# Patient Record
Sex: Female | Born: 1985 | Race: Black or African American | Hispanic: No | Marital: Married | Smoking: Never smoker
Health system: Southern US, Community
[De-identification: ages and names within clinical notes are randomized; demographics above are authoritative.]

## PROBLEM LIST (undated history)

## (undated) DIAGNOSIS — J45909 Unspecified asthma, uncomplicated: Secondary | ICD-10-CM

## (undated) DIAGNOSIS — F32A Depression, unspecified: Secondary | ICD-10-CM

## (undated) DIAGNOSIS — D219 Benign neoplasm of connective and other soft tissue, unspecified: Secondary | ICD-10-CM

## (undated) DIAGNOSIS — F329 Major depressive disorder, single episode, unspecified: Secondary | ICD-10-CM

## (undated) DIAGNOSIS — E559 Vitamin D deficiency, unspecified: Secondary | ICD-10-CM

## (undated) DIAGNOSIS — D649 Anemia, unspecified: Secondary | ICD-10-CM

## (undated) HISTORY — DX: Depression, unspecified: F32.A

## (undated) HISTORY — DX: Benign neoplasm of connective and other soft tissue, unspecified: D21.9

## (undated) HISTORY — PX: HAND SURGERY: SHX662

## (undated) HISTORY — DX: Unspecified asthma, uncomplicated: J45.909

## (undated) HISTORY — DX: Major depressive disorder, single episode, unspecified: F32.9

## (undated) HISTORY — PX: WISDOM TOOTH EXTRACTION: SHX21

## (undated) HISTORY — DX: Anemia, unspecified: D64.9

## (undated) HISTORY — DX: Vitamin D deficiency, unspecified: E55.9

---

## 2017-08-05 NOTE — L&D Delivery Note (Signed)
Delivery Note At 8:26 AM a viable female was delivered via Vaginal, Spontaneous (Presentation: ;  ).  APGAR: 9, 9; weight  .   Placenta status: , .  Cord:  with the following complications: .  Cord pH: sent results pending  Anesthesia:   Episiotomy: None Lacerations: 2nd degree;Perineal Suture Repair: 2.0 vicryl rapide Est. Blood Loss (mL): 4  Mom to postpartum.  Baby to Couplet care / Skin to Skin.    [place] A 04/27/2018, 8:54 AM

## 2017-09-15 LAB — OB RESULTS CONSOLE HIV ANTIBODY (ROUTINE TESTING): HIV: NONREACTIVE

## 2017-09-15 LAB — OB RESULTS CONSOLE ANTIBODY SCREEN: Antibody Screen: NEGATIVE

## 2017-09-15 LAB — OB RESULTS CONSOLE ABO/RH: RH Type: POSITIVE

## 2017-09-15 LAB — OB RESULTS CONSOLE GC/CHLAMYDIA
Chlamydia: NEGATIVE
Gonorrhea: NEGATIVE

## 2017-09-15 LAB — OB RESULTS CONSOLE HEPATITIS B SURFACE ANTIGEN: HEP B S AG: NEGATIVE

## 2017-09-15 LAB — OB RESULTS CONSOLE RUBELLA ANTIBODY, IGM: Rubella: IMMUNE

## 2017-09-15 LAB — OB RESULTS CONSOLE RPR: RPR: NONREACTIVE

## 2017-11-17 ENCOUNTER — Other Ambulatory Visit (HOSPITAL_COMMUNITY): Payer: Self-pay | Admitting: Obstetrics and Gynecology

## 2017-11-17 DIAGNOSIS — Z3A20 20 weeks gestation of pregnancy: Secondary | ICD-10-CM

## 2017-11-17 DIAGNOSIS — Z3689 Encounter for other specified antenatal screening: Secondary | ICD-10-CM

## 2017-12-04 ENCOUNTER — Encounter (HOSPITAL_COMMUNITY): Payer: Self-pay

## 2017-12-04 ENCOUNTER — Ambulatory Visit (HOSPITAL_COMMUNITY)
Admission: RE | Admit: 2017-12-04 | Discharge: 2017-12-04 | Disposition: A | Discharge status: Home / Self Care | Payer: Self-pay | Source: Ambulatory Visit | Attending: Obstetrics and Gynecology | Admitting: Obstetrics and Gynecology

## 2017-12-04 ENCOUNTER — Encounter (HOSPITAL_COMMUNITY): Payer: Self-pay | Admitting: Radiology

## 2017-12-04 DIAGNOSIS — Z363 Encounter for antenatal screening for malformations: Secondary | ICD-10-CM | POA: Insufficient documentation

## 2017-12-04 DIAGNOSIS — Z3689 Encounter for other specified antenatal screening: Secondary | ICD-10-CM

## 2017-12-04 DIAGNOSIS — Z3A20 20 weeks gestation of pregnancy: Secondary | ICD-10-CM

## 2018-03-30 LAB — OB RESULTS CONSOLE GBS: GBS: NEGATIVE

## 2018-04-21 ENCOUNTER — Telehealth (HOSPITAL_COMMUNITY): Payer: Self-pay | Admitting: *Deleted

## 2018-04-21 ENCOUNTER — Encounter (HOSPITAL_COMMUNITY): Payer: Self-pay | Admitting: *Deleted

## 2018-04-21 NOTE — Telephone Encounter (Signed)
Preadmission screen  

## 2018-04-22 ENCOUNTER — Other Ambulatory Visit: Payer: Self-pay | Admitting: Obstetrics & Gynecology

## 2018-04-25 NOTE — H&P (Signed)
Marissa Atkinson is a 32 y.o. female, G1P0 at 40.5 weeks, presenting for induction of labor for postdates.  Pregnancy hx of intramural fibroid and small head circumference of fetus on [place] with resolution at term.  Hx of Depression in college    History of present pregnancy: Patient entered care at 9  weeks.   EDC of 04/20/218 was established by [place].   Anatomy scan:  20 weeks, with<3 % HC and an anterior placenta.   Additional [place] evaluation: serial for growth Last [place] at 37 weeks EFW 41%, BPD 3% and HC 7 % with AFI wnl.   Significant prenatal events:   Last evaluation: this week  OB History    Gravida  1   Para      Term      Preterm      AB      Living        SAB      TAB      Ectopic      Multiple      Live Births             Past Medical History:  Diagnosis Date  . Anemia   . Asthma   . Depression   . Fibroid   . Vitamin D deficiency    Past Surgical History:  Procedure Laterality Date  . HAND SURGERY    . WISDOM TOOTH EXTRACTION     Family History: family history includes Asthma in her maternal grandmother and sister; Hypertension in her mother. Social History:  reports that she has never smoked. She has never used smokeless tobacco. She reports that she drank alcohol. She reports that she does not use drugs.   Prenatal Transfer Tool  Maternal Diabetes: No Genetic Screening: Normal Maternal Ultrasounds/Referrals: Normal Fetal Ultrasounds or other Referrals:  None Maternal Substance Abuse:  No Significant Maternal Medications:  None Significant Maternal Lab Results: None  TDAP Declined Flu UTD  GNO:IBBCWU of Systems  Constitutional: Negative.   HENT: Negative.   Eyes: Negative.   Respiratory: Negative.   Cardiovascular: Negative.   Gastrointestinal: Negative.   Genitourinary: Negative.   Musculoskeletal: Negative.   Skin: Negative.   Neurological: Negative.   Endo/Heme/Allergies: Negative.   Psychiatric/Behavioral: Negative.     No Known  Allergies     Last menstrual period 06/05/2017.  Chest clear Heart RRR without murmur Abd gravid, NT, FH appropriate Pelvic: per RN Ext: +2/+2  FHR: Category 1  FHT 150 accels no decels UCs:  none  Prenatal labs: ABO, Rh: O/Positive/-- (02/11 0000) Antibody: Negative (02/11 0000) Rubella:  Immune (02/11 0000) RPR: Nonreactive (02/11 0000)  HBsAg: Negative (02/11 0000)  HIV: Non-reactive (02/11 0000)  GBS: Negative (08/26 0000) Sickle cell/Hgb electrophoresis:  AA Pap:  2018 wnl GC:  Neg Chlamydia:  Neg Genetic screenings: wnl Glucola:  103 Other:   Hgb 12.6 at NOB, 10.3 at 28 weeks       Assessment/Plan: IUP at 40.5 induction for postdates Cat 1 strip Plan: Admit to [facility] orders Pain med/epidural prn   Pleas Koch ProtheroCNM, MSN 04/25/2018, 11:11 PM

## 2018-04-26 ENCOUNTER — Encounter (HOSPITAL_COMMUNITY): Payer: Self-pay

## 2018-04-26 ENCOUNTER — Inpatient Hospital Stay (HOSPITAL_COMMUNITY)
Admission: RE | Admit: 2018-04-26 | Discharge: 2018-04-29 | DRG: 807 | Disposition: A | Discharge status: Home / Self Care | Payer: BLUE CROSS/BLUE SHIELD | Attending: Obstetrics and Gynecology | Admitting: Obstetrics and Gynecology

## 2018-04-26 ENCOUNTER — Inpatient Hospital Stay (HOSPITAL_COMMUNITY): Payer: BLUE CROSS/BLUE SHIELD | Admitting: Anesthesiology

## 2018-04-26 DIAGNOSIS — Z23 Encounter for immunization: Secondary | ICD-10-CM

## 2018-04-26 DIAGNOSIS — O480 Post-term pregnancy: Secondary | ICD-10-CM | POA: Diagnosis present

## 2018-04-26 DIAGNOSIS — Z3A40 40 weeks gestation of pregnancy: Secondary | ICD-10-CM

## 2018-04-26 DIAGNOSIS — J45909 Unspecified asthma, uncomplicated: Secondary | ICD-10-CM | POA: Diagnosis present

## 2018-04-26 DIAGNOSIS — O9952 Diseases of the respiratory system complicating childbirth: Secondary | ICD-10-CM | POA: Diagnosis present

## 2018-04-26 LAB — CBC
HCT: 34.5 % — ABNORMAL LOW (ref 36.0–46.0)
Hemoglobin: 11.7 g/dL — ABNORMAL LOW (ref 12.0–15.0)
MCH: 30.4 pg (ref 26.0–34.0)
MCHC: 33.9 g/dL (ref 30.0–36.0)
MCV: 89.6 fL (ref 78.0–100.0)
Platelets: 165 10*3/uL (ref 150–400)
RBC: 3.85 MIL/uL — ABNORMAL LOW (ref 3.87–5.11)
RDW: 14 % (ref 11.5–15.5)
WBC: 8.8 10*3/uL (ref 4.0–10.5)

## 2018-04-26 LAB — ABO/RH: ABO/RH(D): O POS

## 2018-04-26 LAB — TYPE AND SCREEN
ABO/RH(D): O POS
Antibody Screen: NEGATIVE

## 2018-04-26 MED ORDER — FENTANYL CITRATE (PF) 100 MCG/2ML IJ SOLN: 50.0000 ug | INTRAMUSCULAR | Status: DC | PRN

## 2018-04-26 MED ORDER — MISOPROSTOL 25 MCG QUARTER TABLET
25.0000 ug | ORAL_TABLET | ORAL | Status: DC | PRN
  Administered 2018-04-26 (×2): 25 ug via VAGINAL
  Filled 2018-04-26 (×4): qty 1

## 2018-04-26 MED ORDER — FLEET ENEMA 7-19 GM/118ML RE ENEM: 1.0000 | ENEMA | RECTAL | Status: DC | PRN

## 2018-04-26 MED ORDER — ONDANSETRON HCL 4 MG/2ML IJ SOLN
4.0000 mg | Freq: Four times a day (QID) | INTRAMUSCULAR | Status: DC | PRN
  Administered 2018-04-26: 4 mg via INTRAVENOUS
  Filled 2018-04-26: qty 2

## 2018-04-26 MED ORDER — FENTANYL 2.5 MCG/ML BUPIVACAINE 1/10 % EPIDURAL INFUSION (WH - ANES)
14.0000 mL/h | INTRAMUSCULAR | Status: DC | PRN
  Administered 2018-04-26 – 2018-04-27 (×2): 14 mL/h via EPIDURAL
  Filled 2018-04-26 (×2): qty 100

## 2018-04-26 MED ORDER — LACTATED RINGERS IV SOLN
500.0000 mL | Freq: Once | INTRAVENOUS | Status: AC
  Administered 2018-04-26: 500 mL via INTRAVENOUS

## 2018-04-26 MED ORDER — PHENYLEPHRINE 40 MCG/ML (10ML) SYRINGE FOR IV PUSH (FOR BLOOD PRESSURE SUPPORT)
80.0000 ug | PREFILLED_SYRINGE | INTRAVENOUS | Status: DC | PRN
  Filled 2018-04-26: qty 10
  Filled 2018-04-26: qty 5

## 2018-04-26 MED ORDER — TERBUTALINE SULFATE 1 MG/ML IJ SOLN
0.2500 mg | Freq: Once | INTRAMUSCULAR | Status: DC | PRN
  Filled 2018-04-26: qty 1

## 2018-04-26 MED ORDER — LACTATED RINGERS AMNIOINFUSION
INTRAVENOUS | Status: DC
  Administered 2018-04-26: 21:00:00 via INTRAUTERINE
  Filled 2018-04-26: qty 1000

## 2018-04-26 MED ORDER — LIDOCAINE HCL (PF) 1 % IJ SOLN
INTRAMUSCULAR | Status: DC | PRN
  Administered 2018-04-26: 13 mL via EPIDURAL

## 2018-04-26 MED ORDER — OXYTOCIN 40 UNITS IN LACTATED RINGERS INFUSION - SIMPLE MED
1.0000 m[IU]/min | INTRAVENOUS | Status: DC
  Administered 2018-04-26: 1 m[IU]/min via INTRAVENOUS
  Filled 2018-04-26: qty 1000

## 2018-04-26 MED ORDER — LIDOCAINE HCL (PF) 1 % IJ SOLN
30.0000 mL | INTRAMUSCULAR | Status: DC | PRN
  Filled 2018-04-26: qty 30

## 2018-04-26 MED ORDER — OXYTOCIN 40 UNITS IN LACTATED RINGERS INFUSION - SIMPLE MED: 2.5000 [IU]/h | INTRAVENOUS | Status: DC

## 2018-04-26 MED ORDER — ACETAMINOPHEN 325 MG PO TABS: 650.0000 mg | ORAL_TABLET | ORAL | Status: DC | PRN

## 2018-04-26 MED ORDER — SOD CITRATE-CITRIC ACID 500-334 MG/5ML PO SOLN: 30.0000 mL | ORAL | Status: DC | PRN

## 2018-04-26 MED ORDER — EPHEDRINE 5 MG/ML INJ
10.0000 mg | INTRAVENOUS | Status: DC | PRN
  Filled 2018-04-26: qty 2

## 2018-04-26 MED ORDER — PHENYLEPHRINE 40 MCG/ML (10ML) SYRINGE FOR IV PUSH (FOR BLOOD PRESSURE SUPPORT)
80.0000 ug | PREFILLED_SYRINGE | INTRAVENOUS | Status: DC | PRN
  Filled 2018-04-26: qty 5

## 2018-04-26 MED ORDER — LACTATED RINGERS IV SOLN
INTRAVENOUS | Status: DC
  Administered 2018-04-26 – 2018-04-27 (×5): via INTRAVENOUS

## 2018-04-26 MED ORDER — LACTATED RINGERS IV SOLN
500.0000 mL | INTRAVENOUS | Status: DC | PRN
  Administered 2018-04-26 (×2): 500 mL via INTRAVENOUS

## 2018-04-26 MED ORDER — DIPHENHYDRAMINE HCL 50 MG/ML IJ SOLN: 12.5000 mg | INTRAMUSCULAR | Status: DC | PRN

## 2018-04-26 MED ORDER — OXYTOCIN BOLUS FROM INFUSION
500.0000 mL | Freq: Once | INTRAVENOUS | Status: AC
  Administered 2018-04-27: 500 mL via INTRAVENOUS

## 2018-04-26 NOTE — Progress Notes (Signed)
Labor Progress Note  Marlan Palau, 32 y.o., G1P0, with an IUP @ [redacted]w[redacted]d, presented for IOL for postdates.   Subjective: Pt resting in bed in stable condition, feeling cxt but able to breath through them. Pt in NAD. Pt wants to eat breakfast, then plan to start on pitocin.   I discussed with patient risks, benefits and alternatives of labor induction including higher risk of cesarean delivery compared to spontaneous labor.  We discussed risks of induction agents including effects on fetal heart beat, contraction pattern and need for close monitoring.  Patient expressed understanding of all this and desired to proceed with the induction. Risks and benefits of induction were reviewed, including failure of method, prolonged labor, need for further intervention, risk of cesarean.  Patient and family verbalized understanding and denies any further questions at this time. Pt and family wish to proceed with induction process. Discussed induction options of Cytotec, foley bulb, AROM, and pitocin were reviewed as well as risks and benefits with use of each discussed.  Patient Active Problem List   Diagnosis Date Noted  . Post-term pregnancy, 40-42 weeks of gestation 04/26/2018   Objective: BP 134/84   Pulse 72   Temp 98.3 F (36.8 C) (Oral)   Resp 16   Ht 5\' 4"  (1.626 m)   Wt 93.4 kg   LMP 06/05/2017 (Approximate)   BMI 35.36 kg/m  No intake/output data recorded. No intake/output data recorded. NST: FHR baseline 135 bpm, Variability: moderate, Accelerations:present, Decelerations:  Absent= Cat 1/Reactive CTX:  irregular, every 2-5 minutes, lasting 60-120seconds Uterus gravid, soft non tender, moderate to palpate with contractions.  SVE:  Dilation: Fingertip Effacement (%): 60 Station: -3 Exam by:: [employer], vertex verified by bedside US, OP positioning.  Pitocin at (Has not started yet) mUn/min  Assessment:  Marlan Palau, 32 y.o., G1P0, with an IUP @ [redacted]w[redacted]d, presented for IOL for  postdates. Pt stable in NAD resting breathing through cxt. Cxt too close to place the third Cytotec.  Patient Active Problem List   Diagnosis Date Noted  . Post-term pregnancy, 40-42 weeks of gestation 04/26/2018   NICHD: Category 1  Membranes:  Intact, no s/s of infection  Induction:    Cytotec x2 at 0100 and 0500  Foley Bulb: Unable to insert due to 0.5cm dilated  Pitocin - Not started  Pain management:               IV pain management: None             Epidural placement:  Not yet  GBS Negative  Plan: Continue labor plan Continuous/intermittent monitoring Rest Ambulate Frequent position changes to facilitate fetal rotation and descent. Will reassess with cervical exam at 1200 or earlier if necessary Plan for epidural when cxt become more intense.  Plan to eat breakfast now then pitocin to be started at 1X1 per unit protocol with a max dose of 10 for continued cervical ripening. Will place foley bulb in 1 cm dilated at next vaginal check.  Anticipate cervical ripening and labor progression and vaginal delivery.   Md Mancel Bale aware of plan and verbalized agreement.   [name], NP-C, CNM, MSN 04/26/2018. 10:44 AM Late entry

## 2018-04-26 NOTE — Progress Notes (Signed)
Labor Progress Note  Marissa Atkinson, 32 y.o., G1P0, with an IUP @ [redacted]w[redacted]d, presented for IOL for postdates.   Subjective: Pt resting in bed,  Pt still able to breath through contractions, but endorses cxt feeling more intense. Call to room by RN for repetative variables @ 2100.   Patient Active Problem List   Diagnosis Date Noted  . Post-term pregnancy, 40-42 weeks of gestation 04/26/2018   Objective: BP 137/78   Pulse 77   Temp 98.5 F (36.9 C) (Oral)   Resp 16   Ht 5\' 4"  (1.626 m)   Wt 93.4 kg   LMP 06/05/2017 (Approximate)   BMI 35.36 kg/m  No intake/output data recorded. No intake/output data recorded. NST: FHR baseline 135 bpm, Variability: moderate, Accelerations:absent, Decelerations:  present deep variable with cxt to nadir of 70s= Cat 2 over last hour from 2100-2200. Position change.  CTX:  irregular, every 2-6 minutes, lasting 60-110 seconds Uterus gravid, soft non tender, moderate to palpate with contractions.  SVE:  Dilation: 5 Effacement (%): 70 Station: -2 Exam by:: Jonathon Tan, CNM Pitocin at (2) mUn/min  At 2100; in response to deep variables, Dr Alwyn Pea was notified, IUPC placed after permission was verbalized about procedure from pt and family, pt tolerated it well, amnioinfusion was started at 364ml bolus with 167ml/hr for maintinanes, and position changed, no resolution.   At 2130: Deep variables with no acels, and minimal variability were continued, Cat 2, turned pitocin off.   At 2200: Cat 2 continued with variables, now moderate variablility, [place] O2 placed on pt, repositioned now hands and knees. No pitocin.   At 2230: Still cat 2 with variability starting to resolve, nadir to 100 only, quick return to baseline, cxt starting to space every 2-4 mins. Pt in NAD. Family consensus with RN to continue to d/c pitocin and allow fetus to cover.   Assessment:  Marissa Atkinson, 32 y.o., G1P0, with an IUP @ [redacted]w[redacted]d, presented for IOL for postdates. Pt stable in NAD resting breathing  through cxt.Reoccuring deep variables, noted with intrauterine resuscitation  used.   Patient Active Problem List   Diagnosis Date Noted  . Post-term pregnancy, 40-42 weeks of gestation 04/26/2018   NICHD: Category 2  Intrauterine resuscitation used such as repositioning, o2nc, IV bolus, amnioinfusion. See  note above in objective for further details.   IUPC: Placed with amniofusion of 398mls bolus and 126ml/hr  MVU: 160 at time of IUPC placement.  Membranes:  SROM at 2000 on 09/22 X2 hours, no s/s of infection  Induction:    Cytotec x2 at 0100 and 0500  Foley Bulb: inserted at 12:30 on 09/22, out @ 1715  Pitocin - OFF at 2130  Pain management:               IV pain management: None             Epidural placement:  Not yet  GBS Negative  Plan: Continue labor plan Continuous/intermittent monitoring Rest Frequent position changes to facilitate fetal rotation and descent. Will reassess with cervical exam for cervical dilation.  Plan for epidural when cxt become more intense.  Pitocin stopped to allow fetus to recover from deep variables.  Continue amnioinfusion at 134ml/hr Anticipate resolution of variables and  labor progression with vaginal delivery.   Md Alwyn Pea was updated about cat 2 strip with reoccurring variables.  [name], NP-C, CNM, MSN 04/26/2018. 10:23 PM Late entry

## 2018-04-26 NOTE — Progress Notes (Signed)
Labor Progress Note  Marlan Palau, 32 y.o., G1P0, with an IUP @ [redacted]w[redacted]d, presented for IOL for postdates.   Subjective: Pt resting in bed,  Post epidural placement, sleeping and comfortable now, RN report IUPC fell out during transition post epidural placement.  Patient Active Problem List   Diagnosis Date Noted  . Post-term pregnancy, 40-42 weeks of gestation 04/26/2018   Objective: BP 130/76   Pulse 74   Temp 98 F (36.7 C) (Axillary)   Resp 16   Ht 5\' 4"  (1.626 m)   Wt 93.4 kg   LMP 06/05/2017 (Approximate)   SpO2 96%   BMI 35.36 kg/m  No intake/output data recorded. No intake/output data recorded. NST: FHR baseline 130bpm, Variability: moderate, Accelerations:presentt, Decelerations:  Absent for last 20 mins= Cat 1/Reactive.  CTX:  irregular, every 2-6 minutes, lasting 60-110 seconds Uterus gravid, soft non tender, moderate to palpate with contractions.   SVE:  Dilation: 6 Effacement (%): 70 Station: -2 Exam by:: Sheryle Hail, RN Pitocin at (0) mUn/min  Assessment:  Marlan Palau, 32 y.o., G1P0, with an IUP @ [redacted]w[redacted]d, presented for IOL for postdates. Pt stable in NAD sleeping post epidural placement.   Deep variable resolved with pitocin off. Pt left sleepin gpost epidural and IUPC with amnioinfusion fell out.  Patient Active Problem List   Diagnosis Date Noted  . Post-term pregnancy, 40-42 weeks of gestation 04/26/2018   NICHD: Category 1   IUPC: [place] out  Membranes:  SROM at 2000 on 09/22 X4 hours, no s/s of infection  Induction:    Cytotec x2 at 0100 and 0500  Foley Bulb: inserted at 12:30 on 09/22, out @ 1715  Pitocin - OFF at 2130  Pain management:               IV pain management: None             Epidural placement:  09/22 @ 1130  GBS Negative  Plan: Continue labor plan Allow pt to sleep.  Continuous/intermittent monitoring Rest/Frequent position changes to facilitate fetal rotation and descent. Will reassess with cervical exam for cervical  dilation. . IUPC to be replaced if repetitive deceleration are noted.  Pitocin to be restarted if cat 1 strip in one hour if cxt space out..  Anticipate  labor progression with vaginal delivery.   [name], NP-C, CNM, MSN 04/26/2018. 11:59 PM Late entry

## 2018-04-26 NOTE — Anesthesia Preprocedure Evaluation (Signed)
Anesthesia Evaluation  Patient identified by MRN, date of birth, ID band Patient awake    Reviewed: Allergy & Precautions, NPO status , Patient's Chart, lab work & pertinent test results  Airway Mallampati: II  TM Distance: >3 FB Neck ROM: Full    Dental no notable dental hx.    Pulmonary neg pulmonary ROS,    Pulmonary exam normal breath sounds clear to auscultation       Cardiovascular negative cardio ROS Normal cardiovascular exam Rhythm:Regular Rate:Normal     Neuro/Psych negative neurological ROS  negative psych ROS   GI/Hepatic negative GI ROS, Neg liver ROS,   Endo/Other  negative endocrine ROS  Renal/GU negative Renal ROS  negative genitourinary   Musculoskeletal negative musculoskeletal ROS (+)   Abdominal   Peds negative pediatric ROS (+)  Hematology negative hematology ROS (+)   Anesthesia Other Findings   Reproductive/Obstetrics negative OB ROS (+) Pregnancy                             Anesthesia Physical Anesthesia Plan  ASA: II  Anesthesia Plan: Epidural   Post-op Pain Management:    Induction:   PONV Risk Score and Plan:   Airway Management Planned:   Additional Equipment:   Intra-op Plan:   Post-operative Plan:   Informed Consent:   Plan Discussed with:   Anesthesia Plan Comments:         Anesthesia Quick Evaluation

## 2018-04-26 NOTE — Progress Notes (Addendum)
Labor Progress Note  Marissa Atkinson, 32 y.o., G1P0, with an IUP @ [redacted]w[redacted]d, presented for IOL for postdates.   Subjective: Pt resting in bed sitting up eating chicken broth,  Pt still able to nap and breath through contractions.   Patient Active Problem List   Diagnosis Date Noted  . Post-term pregnancy, 40-42 weeks of gestation 04/26/2018   Objective: BP 138/77   Pulse 70   Temp 98.3 F (36.8 C)   Resp 16   Ht 5\' 4"  (1.626 m)   Wt 93.4 kg   LMP 06/05/2017 (Approximate)   BMI 35.36 kg/m  No intake/output data recorded. No intake/output data recorded. NST: FHR baseline 135 bpm, Variability: moderate, Accelerations:present, Decelerations:  Absent= Cat 1/Reactive over last hour.  CTX:  irregular, every 2-6 minutes, lasting 60-110 seconds Uterus gravid, soft non tender, moderate to palpate with contractions.  SVE:  Dilation: 3 Effacement (%): 70 Station: -2 Exam by:: Feleica Fulmore, CNM, vertex verified by bedside US  Pitocin at (to start at 1x1 now) mUn/min  Foley bulb out during this cervical check   Assessment:  Marissa Atkinson, 32 y.o., G1P0, with an IUP @ [redacted]w[redacted]d, presented for IOL for postdates. Pt stable in NAD resting breathing through cxt. Pt napped, and foley bulb out now. SVE: 3/70/-2, progressing.  Patient Active Problem List   Diagnosis Date Noted  . Post-term pregnancy, 40-42 weeks of gestation 04/26/2018   NICHD: Category 1  Membranes:  Intact, no s/s of infection  Induction:    Cytotec x2 at 0100 and 0500  Foley Bulb: inserted at 12:30 on 09/22, out @ 1715  Pitocin - to start now  Pain management:               IV pain management: None             Epidural placement:  Not yet  GBS Negative  Plan: Continue labor plan Continuous/intermittent monitoring Rest Ambulate Frequent position changes to facilitate fetal rotation and descent. Will reassess with cervical exam at 2100 or earlier if necessary Plan for epidural when cxt become more intense.  Start pitocin now  1x1 Anticipate cervical ripening and labor progression and vaginal delivery.   Md Mancel Bale will be updated.  [name], NP-C, CNM, MSN 04/26/2018. 5:36 PM Late entry

## 2018-04-26 NOTE — Anesthesia Procedure Notes (Signed)
Epidural Patient location during procedure: OB Start time: 04/26/2018 11:32 PM End time: 04/26/2018 11:48 PM  Staffing Anesthesiologist: Lynda Rainwater, MD Performed: anesthesiologist   Preanesthetic Checklist Completed: patient identified, site marked, surgical consent, pre-op evaluation, timeout performed, IV checked, risks and benefits discussed and monitors and equipment checked  Epidural Patient position: sitting Prep: ChloraPrep Patient monitoring: heart rate, cardiac monitor, continuous pulse ox and blood pressure Approach: midline Location: L2-L3 Injection technique: LOR saline  Needle:  Needle type: Tuohy  Needle gauge: 17 G Needle length: 9 cm Needle insertion depth: 5 cm Catheter type: closed end flexible Catheter size: 20 Guage Catheter at skin depth: 9 cm Test dose: negative  Assessment Events: blood not aspirated, injection not painful, no injection resistance, negative IV test and no paresthesia  Additional Notes Reason for block:procedure for pain

## 2018-04-26 NOTE — Anesthesia Pain Management Evaluation Note (Signed)
  CRNA Pain Management Visit Note  Patient: Marissa Atkinson, 32 y.o., female  "Hello I am a member of the anesthesia team at [facility]. We have an anesthesia team available at all times to provide care throughout the hospital, including epidural management and anesthesia for C-section. I don't know your plan for the delivery whether it a natural birth, water birth, IV sedation, nitrous supplementation, doula or epidural, but we want to meet your pain goals."   1.Was your pain managed to your expectations on prior hospitalizations?   Yes   2.What is your expectation for pain management during this hospitalization?     Epidural  3.How can we help you reach that goal? Patient will call for epidural   Record the patient's initial score and the patient's pain goal.   Pain: 10  Pain Goal: 10 [facility] wants you to be able to say your pain was always managed very well.  Rayvon Char 04/26/2018

## 2018-04-26 NOTE — Progress Notes (Signed)
Labor Progress Note  Marissa Atkinson, 32 y.o., G1P0, with an IUP @ [redacted]w[redacted]d, presented for IOL for postdates.   Subjective: Pt resting in bed sitting up . Pt still able to nap and breath through contractions. Was called by RN, pt felt a gush of fluids and believes her water broke.   Patient Active Problem List   Diagnosis Date Noted  . Post-term pregnancy, 40-42 weeks of gestation 04/26/2018   Objective: BP 137/78   Pulse 77   Temp 98.5 F (36.9 C) (Oral)   Resp 16   Ht 5\' 4"  (1.626 m)   Wt 93.4 kg   LMP 06/05/2017 (Approximate)   BMI 35.36 kg/m  No intake/output data recorded. No intake/output data recorded. NST: FHR baseline 135 bpm, Variability: moderate, Accelerations:present, Decelerations:  Absent= Cat 1/Reactive over last hour.  CTX:  irregular, every 2-6 minutes, lasting 60-110 seconds Uterus gravid, soft non tender, moderate to palpate with contractions.  SVE:  Dilation: 4 Effacement (%): 70 Station: -2 Exam by:: Marqual Mi, CNM  Pitocin at (2) mUn/min  SROM, Clear.   Assessment:  Marissa Atkinson, 32 y.o., G1P0, with an IUP @ [redacted]w[redacted]d, presented for IOL for postdates. Pt stable in NAD resting breathing through cxt. Pt napped, and foley bulb out now. SVE: 3/70/-2, progressing.  Patient Active Problem List   Diagnosis Date Noted  . Post-term pregnancy, 40-42 weeks of gestation 04/26/2018   NICHD: Category 1  Membranes:  Intact, no s/s of infection  Induction:    Cytotec x2 at 0100 and 0500  Foley Bulb: inserted at 12:30 on 09/22, out @ 1715  Pitocin - 2  Pain management:               IV pain management: None             Epidural placement:  Not yet  GBS Negative  Plan: Continue labor plan Continuous/intermittent monitoring Rest Ambulate Frequent position changes to facilitate fetal rotation and descent. Will reassess with cervical exam  When nessacery or as indicated.  Plan for epidural when cxt become more intense.  Continue pitocin 1x1 Anticipate cervical  ripening and labor progression and vaginal delivery.   Md Mancel Bale will be updated.  [name], NP-C, CNM, MSN 04/26/2018. 10:18 PM Late entry

## 2018-04-26 NOTE — Progress Notes (Signed)
Labor Progress Note  Marissa Atkinson, 32 y.o., G1P0, with an IUP @ [redacted]w[redacted]d, presented for IOL for postdates.   Subjective: Pt resting in bed in stable condition,post eating breakfast, pt has family at bedside, pt endorses still having cxt but able to breath through them. Foley bulb reviewed with pt and pt consented to having one placed if cervical dilation is 1 cm.   Patient Active Problem List   Diagnosis Date Noted  . Post-term pregnancy, 40-42 weeks of gestation 04/26/2018   Objective: BP 128/74   Pulse 70   Temp 98.3 F (36.8 C)   Resp 12   Ht 5\' 4"  (1.626 m)   Wt 93.4 kg   LMP 06/05/2017 (Approximate)   BMI 35.36 kg/m  No intake/output data recorded. No intake/output data recorded. NST: FHR baseline 135 bpm, Variability: moderate, Accelerations:present, Decelerations:  Absent= Cat 1/Reactive over last hour.  CTX:  irregular, every 2-4 minutes, lasting 40-80seconds Uterus gravid, soft non tender, moderate to palpate with contractions.  SVE:  Dilation: 1 Effacement (%): 60 Station: -3 Exam by:: [facility], CNM, vertex verified by bedside US  Pitocin at (Has not started yet) mUn/min  Foley bulb placed post being given permission and pt and fetus tolerated procedure well.   Assessment:  Marissa Atkinson, 32 y.o., G1P0, with an IUP @ [redacted]w[redacted]d, presented for IOL for postdates. Pt stable in NAD resting breathing through cxt. Placed foley bulb, pt 1cm dilated and tolerated well. Comfortable breathing through contractions. Has been using birthing ball and walking for induction and has been working well.   Patient Active Problem List   Diagnosis Date Noted  . Post-term pregnancy, 40-42 weeks of gestation 04/26/2018   NICHD: Category 1  Membranes:  Intact, no s/s of infection  Induction:    Cytotec x2 at 0100 and 0500  Foley Bulb: inserted at 12:30 on 09/22  Pitocin - Not started  Pain management:               IV pain management: None             Epidural placement:  Not  yet  GBS Negative  Plan: Continue labor plan Continuous/intermittent monitoring Rest Ambulate Frequent position changes to facilitate fetal rotation and descent. Will reassess with cervical exam at 1700 or earlier if necessary Plan for epidural when cxt become more intense.  Plan to start pitocin when foley bulb falls out or if contractions space at 2X2 Anticipate cervical ripening and labor progression and vaginal delivery.   Md Mancel Bale updated about foley bulb placement.   [name], NP-C, CNM, MSN 04/26/2018. 2:43 PM Late entry

## 2018-04-27 ENCOUNTER — Encounter (HOSPITAL_COMMUNITY): Payer: Self-pay

## 2018-04-27 LAB — RPR: RPR Ser Ql: NONREACTIVE

## 2018-04-27 MED ORDER — ACETAMINOPHEN 325 MG PO TABS
650.0000 mg | ORAL_TABLET | ORAL | Status: DC | PRN
  Administered 2018-04-27: 650 mg via ORAL
  Filled 2018-04-27: qty 2

## 2018-04-27 MED ORDER — IBUPROFEN 600 MG PO TABS
600.0000 mg | ORAL_TABLET | Freq: Four times a day (QID) | ORAL | Status: DC
  Administered 2018-04-27 – 2018-04-29 (×8): 600 mg via ORAL
  Filled 2018-04-27 (×9): qty 1

## 2018-04-27 MED ORDER — ZOLPIDEM TARTRATE 5 MG PO TABS: 5.0000 mg | ORAL_TABLET | Freq: Every evening | ORAL | Status: DC | PRN

## 2018-04-27 MED ORDER — COCONUT OIL OIL
1.0000 "application " | TOPICAL_OIL | Status: DC | PRN
  Administered 2018-04-28: 1 via TOPICAL
  Filled 2018-04-27: qty 120

## 2018-04-27 MED ORDER — FUSION PLUS PO CAPS: 1.0000 | ORAL_CAPSULE | Freq: Every day | ORAL | Status: DC

## 2018-04-27 MED ORDER — PRENATAL MULTIVITAMIN CH
1.0000 | ORAL_TABLET | Freq: Every day | ORAL | Status: DC
  Administered 2018-04-27 – 2018-04-29 (×3): 1 via ORAL
  Filled 2018-04-27 (×3): qty 1

## 2018-04-27 MED ORDER — MEASLES, MUMPS & RUBELLA VAC ~~LOC~~ INJ
0.5000 mL | INJECTION | Freq: Once | SUBCUTANEOUS | Status: DC
  Filled 2018-04-27: qty 0.5

## 2018-04-27 MED ORDER — WITCH HAZEL-GLYCERIN EX PADS: 1.0000 "application " | MEDICATED_PAD | CUTANEOUS | Status: DC | PRN

## 2018-04-27 MED ORDER — BENZOCAINE-MENTHOL 20-0.5 % EX AERO: 1.0000 "application " | INHALATION_SPRAY | CUTANEOUS | Status: DC | PRN

## 2018-04-27 MED ORDER — SENNOSIDES-DOCUSATE SODIUM 8.6-50 MG PO TABS
2.0000 | ORAL_TABLET | ORAL | Status: DC
  Administered 2018-04-27 – 2018-04-28 (×2): 2 via ORAL
  Filled 2018-04-27 (×2): qty 2

## 2018-04-27 MED ORDER — ALBUTEROL SULFATE (2.5 MG/3ML) 0.083% IN NEBU: 2.5000 mg | INHALATION_SOLUTION | Freq: Four times a day (QID) | RESPIRATORY_TRACT | Status: DC | PRN

## 2018-04-27 MED ORDER — ONDANSETRON HCL 4 MG PO TABS: 4.0000 mg | ORAL_TABLET | ORAL | Status: DC | PRN

## 2018-04-27 MED ORDER — OXYTOCIN 40 UNITS IN LACTATED RINGERS INFUSION - SIMPLE MED: 1.0000 m[IU]/min | INTRAVENOUS | Status: DC

## 2018-04-27 MED ORDER — DIBUCAINE 1 % RE OINT: 1.0000 "application " | TOPICAL_OINTMENT | RECTAL | Status: DC | PRN

## 2018-04-27 MED ORDER — DIPHENHYDRAMINE HCL 25 MG PO CAPS: 25.0000 mg | ORAL_CAPSULE | Freq: Four times a day (QID) | ORAL | Status: DC | PRN

## 2018-04-27 MED ORDER — TETANUS-DIPHTH-ACELL PERTUSSIS 5-2.5-18.5 LF-MCG/0.5 IM SUSP: 0.5000 mL | Freq: Once | INTRAMUSCULAR | Status: DC

## 2018-04-27 MED ORDER — ONDANSETRON HCL 4 MG/2ML IJ SOLN: 4.0000 mg | INTRAMUSCULAR | Status: DC | PRN

## 2018-04-27 MED ORDER — SIMETHICONE 80 MG PO CHEW: 80.0000 mg | CHEWABLE_TABLET | ORAL | Status: DC | PRN

## 2018-04-27 NOTE — Lactation Note (Signed)
This note was copied from a baby's chart. Lactation Consultation Note  Patient Name: Marissa Atkinson RAQTM'A Date: 04/27/2018 Reason for consult: Initial assessment;Term;1st time breastfeeding;Primapara   Initial consult with first time mom of 61 hour old infant. Infant with 3 BF attempts for 0-5 minutes, 1 stool and 1 emesis since birth. LATCH scores 8. Infant has been spitting and not really interested in latching.   Mom was able to hand express colostrum. Infant was not interested in latching at this time.   Reviewed I/O, BF basics, pillow support, hand expression, positioning, milk coming to volume, NB nutritional needs, Cluster feeding and NB feeding behaviors.   Reviewed with mom that infants who have swallowed amniotic fluid and is spitty may not be willing to feed well for a while. Enc STS and hand expression with mom. Dad currently performing STS with infant.    BF Resources handout and [facility] brochure given, mom informed of IP/OP Services, BF Support Groups, and [place] phone #.   Mom has Motif pump for home use. Mom is a [facility] client and has taken BF classes with WIC.  Mom reports she has no questions/concerns at this time. Mom to call out for feeding assistance as needed.   Maternal Data Formula Feeding for Exclusion: No Has patient been taught Hand Expression?: Yes Does the patient have breastfeeding experience prior to this delivery?: No  Feeding Feeding Type: Breast Fed Length of feed: 0 min  LATCH Score Latch: Too sleepy or reluctant, no latch achieved, no sucking elicited.  Audible Swallowing: None  Type of Nipple: Everted at rest and after stimulation  Comfort (Breast/Nipple): Soft / non-tender  Hold (Positioning): Assistance needed to correctly position infant at breast and maintain latch.  LATCH Score: 5  Interventions Interventions: Breast feeding basics reviewed;Support pillows;Assisted with latch;Position options;Skin to skin;Breast massage;Breast  compression;Hand express;Expressed milk  Lactation Tools Discussed/Used WIC Program: Yes   Consult Status Consult Status: Follow-up Date: 04/28/18 Follow-up type: In-patient    Debby Freiberg Billijo Dilling 04/27/2018, 3:20 PM

## 2018-04-27 NOTE — Progress Notes (Signed)
Labor Progress Note  Marissa Atkinson, 32 y.o., G1P0, with an IUP @ [redacted]w[redacted]d, presented for IOL for postdates.   Subjective: Pt resting in bed,  Comfortable with epidural placement, pt in high fowlers to sitting position in bed. Intermitent sleeping and comfortable now, RN reports , strip is looking better with decreased decels noted.   Patient Active Problem List   Diagnosis Date Noted  . Post-term pregnancy, 40-42 weeks of gestation 04/26/2018   Objective: BP 120/69   Pulse 82   Temp 99 F (37.2 C) (Axillary)   Resp 17   Ht 5\' 4"  (1.626 m)   Wt 93.4 kg   LMP 06/05/2017 (Approximate)   SpO2 98%   BMI 35.36 kg/m  No intake/output data recorded. Total I/O In: -  Out: 500 [Urine:500] NST: FHR baseline 130 bpm, Variability: moderate, Accelerations present, Decelerations:  Present, x1 prolonged decel. =Cat 2 at 0530 but changed to Cat 1 at 0600 with position change  /Reactive to scalp stimulation  CTX:  irregular, every 2-6 minutes, lasting 110-140 seconds Uterus gravid, soft non tender, moderate to palpate with contractions.   SVE:  Dilation: Lip/rim Effacement (%): 80 Station: 0, Plus 1 Exam by:: Marissa Atkinson, CNM Pitocin at (2) mUn/min  Assessment:  Marissa Atkinson, 32 y.o., G1P0, with an IUP @ [redacted]w[redacted]d, presented for IOL for postdates. Pt stable in NAD sitting up in bed with epidural in place.   Attempted trial push to push rim/lip of cervix away, but pt did not make progress and fetus had one small shallow prolonged decel.  Started pit now at 2x2. Pt left in exaggerated left fowlers.  Patient Active Problem List   Diagnosis Date Noted  . Post-term pregnancy, 40-42 weeks of gestation 04/26/2018   NICHD: Category 2 at 0530, but quickly changed to cat 1 (Performed position change, scalp stimulation reactive)   6:18 AM Cat 1  IUPC: [place] out  Membranes:  SROM at 2000 on 09/22 X8 hours, no s/s of infection  Induction:    Cytotec x2 at 0100 and 0500  Foley Bulb: inserted at 12:30 on  09/22, out @ 1715  Pitocin - Started back at 0530 at 2, now at 0600 pit at 4  Pain management:               IV pain management: None             Epidural placement:  09/22 @ 1130  GBS Negative  Plan: Continue labor plan Allow pt to sleep.  Continuous/intermittent monitoring Rest/Frequent position changes to facilitate fetal rotation and descent. Will reassess with cervical exam for cervical dilation. . Plan to labor down, will recheck when pt feels pressure or unless fetus makes fetal heart tone changes.  Start pitocin at 2X2 for spaced contractions.  Anticipate  labor progression with vaginal delivery.     Marissa Pick, NP-C, CNM, MSN 04/27/2018. 6:06 AM Late entry

## 2018-04-27 NOTE — Progress Notes (Signed)
IFSE  Removed with tip intact

## 2018-04-27 NOTE — Anesthesia Postprocedure Evaluation (Signed)
Anesthesia Post Note  Patient: Social worker  Procedure(s) Performed: AN AD [place]     Patient location during evaluation: Mother Baby Anesthesia Type: Epidural Level of consciousness: awake and alert and oriented Pain management: satisfactory to patient Vital Signs Assessment: post-procedure vital signs reviewed and stable Respiratory status: respiratory function stable Cardiovascular status: stable Postop Assessment: no headache, no backache, epidural receding, patient able to bend at knees, no signs of nausea or vomiting and adequate PO intake Anesthetic complications: no    Last Vitals:  Vitals:   04/27/18 1129 04/27/18 1559  BP: 128/76 124/71  Pulse: 88 77  Resp: 17 16  Temp: 37.3 C 37.3 C  SpO2:  99%    Last Pain:  Vitals:   04/27/18 1559  TempSrc: Axillary  PainSc: 0-No pain   Pain Goal:                 Burgandy Hackworth

## 2018-04-27 NOTE — Progress Notes (Addendum)
Labor Progress Note  Marlan Palau, 32 y.o., G1P0, with an IUP @ [redacted]w[redacted]d, presented for IOL for postdates.   Subjective: Pt resting in bed,  Comfortable with epidural placement, pt in high fowlers to sitting position in bed.  sleeping and comfortable now, RN report severe reoccurring decels that return to baseline with position change.   Patient Active Problem List   Diagnosis Date Noted  . Post-term pregnancy, 40-42 weeks of gestation 04/26/2018   Objective: BP 131/85   Pulse 90   Temp 98.3 F (36.8 C) (Axillary)   Resp 17   Ht 5\' 4"  (1.626 m)   Wt 93.4 kg   LMP 06/05/2017 (Approximate)   SpO2 100%   BMI 35.36 kg/m  No intake/output data recorded. No intake/output data recorded. NST: FHR baseline 135 bpm, Variability: moderate, Accelerations present with scalp stimulation, Decelerations:  Present, x1 prolonged decel, with several early decels. = Cat 2/Reactive to scalp stimulation  CTX:  irregular, every 2-6 minutes, lasting 110- seconds Uterus gravid, soft non tender, moderate to palpate with contractions.   SVE:  Dilation: Lip/rim Effacement (%): 80 Station: 0 Exam by:: Luvenia Starch, CNM Pitocin at (0) mUn/min  Assessment:  Marlan Palau, 33 y.o., G1P0, with an IUP @ [redacted]w[redacted]d, presented for IOL for postdates. Pt stable in NAD sitting up in bed with epidural in place.   Variables, decels with x1 prolonge d decel and earlies seen over last hour, position change helps, fetus reactive to scalp stimulation.  Patient Active Problem List   Diagnosis Date Noted  . Post-term pregnancy, 40-42 weeks of gestation 04/26/2018   NICHD: Category 2 (Performed position change, scalp stimulation reactive)    IUPC: [place] [address] out  Membranes:  SROM at 2000 on 09/22 X8 hours, no s/s of infection  Induction:    Cytotec x2 at 0100 and 0500  Foley Bulb: inserted at 12:30 on 09/22, out @ 1715  Pitocin - OFF at 2130  Pain management:               IV pain management: None             Epidural placement:   09/22 @ 1130  GBS Negative  Plan: Continue labor plan Allow pt to sleep.  Continuous/intermittent monitoring Rest/Frequent position changes to facilitate fetal rotation and descent. Will reassess with cervical exam for cervical dilation. . IUPC to be replaced if repetitive deceleration are noted.  Plan to labor down, will recheck when pt feels pressure or unless fetus makes fetal heart tone changes.  Anticipate  labor progression with vaginal delivery.   Dr Alwyn Pea aware of continued decels, recommended an FSE to be placed, but pt is now almost fully dilated. Will labor down then deliver. [name], NP-C, CNM, MSN 04/27/2018. 4:03 AM Late entry

## 2018-04-28 LAB — CBC
HCT: 28 % — ABNORMAL LOW (ref 36.0–46.0)
Hemoglobin: 9.6 g/dL — ABNORMAL LOW (ref 12.0–15.0)
MCH: 30.7 pg (ref 26.0–34.0)
MCHC: 34.3 g/dL (ref 30.0–36.0)
MCV: 89.5 fL (ref 78.0–100.0)
Platelets: 132 10*3/uL — ABNORMAL LOW (ref 150–400)
RBC: 3.13 MIL/uL — ABNORMAL LOW (ref 3.87–5.11)
RDW: 14 % (ref 11.5–15.5)
WBC: 12.7 10*3/uL — ABNORMAL HIGH (ref 4.0–10.5)

## 2018-04-28 NOTE — Progress Notes (Signed)
MOB was referred for history of depression/anxiety. * Referral screened out by Clinical Social Worker because none of the following criteria appear to apply: ~ History of anxiety/depression during this pregnancy, or of post-partum depression following prior delivery. ~ Diagnosis of anxiety and/or depression within last 3 years OR * MOB's symptoms currently being treated with medication and/or therapy. Please contact the Clinical Social Worker if needs arise, by MOB request, or if MOB scores greater than 9/yes to question 10 on Edinburgh Postpartum Depression Screen.  Rhylan Gross, LCSW Clinical Social Worker  [facility]  [phone]  

## 2018-04-28 NOTE — Lactation Note (Signed)
This note was copied from a baby's chart. Lactation Consultation Note  Patient Name: Marissa Atkinson XAJOI'N Date: 04/28/2018 Reason for consult: Follow-up assessment;Difficult latch;Term;Primapara Mom called out for latch assist.  Baby positioned skin to skin in football hold.  Colostrum easily hand expressed.  Baby does not open wide and latches to tip of nipple.  Mom reports this is what baby has been doing all along.  Several attempts made with no improvement.  24 mm nipple shield applied and baby easily latched well.  Active suck/swallow observed although he needed to be relatched frequently.  Observed a 15 minute feeding and colostrum in the shield after feeding.  Symphony pump set up and initiated.  Good flow of colostrum noted.  Instructed to feed on cue and post pump every 3 hours giving colostrum back to baby with a spoon.  Encouraged to call out for assist prn.  Maternal Data Has patient been taught Hand Expression?: Yes Does the patient have breastfeeding experience prior to this delivery?: No  Feeding Feeding Type: Breast Fed Length of feed: 15 min  LATCH Score Latch: Repeated attempts needed to sustain latch, nipple held in mouth throughout feeding, stimulation needed to elicit sucking reflex.  Audible Swallowing: A few with stimulation  Type of Nipple: Everted at rest and after stimulation  Comfort (Breast/Nipple): Soft / non-tender  Hold (Positioning): Assistance needed to correctly position infant at breast and maintain latch.  LATCH Score: 7  Interventions Interventions: Assisted with latch;Breast compression;Shells;Skin to skin;Adjust position;Breast massage;Support pillows;Hand express;Position options;DEBP  Lactation Tools Discussed/Used Tools: Nipple Shields Nipple shield size: 24 Pump Review: Setup, frequency, and cleaning;Milk Storage Initiated by:: LM Date initiated:: 04/28/18   Consult Status Consult Status: Follow-up Date: 04/29/18    Ave Filter 04/28/2018, 12:31 PM

## 2018-04-28 NOTE — Lactation Note (Signed)
This note was copied from a baby's chart. Lactation Consultation Note  Patient Name: Marissa Atkinson KQASU'O Date: 04/28/2018 Reason for consult: Follow-up assessment;Difficult latch Mom pumped 15 mls and baby took by finger feeding and curved tip syringe.  Baby then put back to breast with 24 mm nipple shield and fed for 5 minutes then fell asleep.  Mom will watch for feeding cues.  Maternal Data Has patient been taught Hand Expression?: Yes Does the patient have breastfeeding experience prior to this delivery?: No  Feeding Feeding Type: Breast Fed Length of feed: 5 min  LATCH Score Latch: Grasps breast easily, tongue down, lips flanged, rhythmical sucking.  Audible Swallowing: A few with stimulation  Type of Nipple: Everted at rest and after stimulation  Comfort (Breast/Nipple): Soft / non-tender  Hold (Positioning): Assistance needed to correctly position infant at breast and maintain latch.  LATCH Score: 8  Interventions Interventions: Assisted with latch  Lactation Tools Discussed/Used Tools: Nipple Shields Nipple shield size: 24 Pump Review: Setup, frequency, and cleaning;Milk Storage Initiated by:: LM Date initiated:: 04/28/18   Consult Status Consult Status: Follow-up Date: 04/29/18    Ave Filter 04/28/2018, 2:08 PM

## 2018-04-28 NOTE — Progress Notes (Signed)
Subjective: Postpartum Day 1: Vaginal delivery, 2nd laceration Patient up ad lib, reports no syncope or dizziness. Feeding:  Breastfeeding Contraceptive plan:  undecided  Objective: Vital signs in last 24 hours: Temp:  [97.8 F (36.6 C)-99.1 F (37.3 C)] 97.8 F (36.6 C) (09/24 0536) Pulse Rate:  [66-99] 66 (09/24 0536) Resp:  [16-20] 16 (09/24 0536) BP: (118-139)/(65-89) 123/73 (09/24 0536) SpO2:  [99 %] 99 % (09/23 1559)  Physical Exam:  General: alert, cooperative and no distress Lochia: appropriate Uterine Fundus: firm Perineum: healing well DVT Evaluation: No evidence of DVT seen on physical exam.   CBC Latest Ref Rng & Units 04/28/2018 04/26/2018  WBC 4.0 - 10.5 K/uL 12.7(H) 8.8  Hemoglobin 12.0 - 15.0 g/dL 9.6(L) 11.7(L)  Hematocrit 36.0 - 46.0 % 28.0(L) 34.5(L)  Platelets 150 - 400 K/uL 132(L) 165     Assessment/Plan: Status post vaginal delivery day 1. Stable Continue current care. Plan for discharge tomorrow, Breastfeeding and Lactation consult    Pleas Koch ProtheroCNM 04/28/2018, 7:11 AM

## 2018-04-29 MED ORDER — INFLUENZA VAC SPLIT QUAD 0.5 ML IM SUSY
0.5000 mL | PREFILLED_SYRINGE | INTRAMUSCULAR | Status: AC
  Administered 2018-04-29: 0.5 mL via INTRAMUSCULAR

## 2018-04-29 MED ORDER — IBUPROFEN 600 MG PO TABS: 600.0000 mg | ORAL_TABLET | Freq: Four times a day (QID) | ORAL | 0 refills | Status: AC

## 2018-04-29 NOTE — Discharge Summary (Signed)
OB Discharge Summary     Patient Name: Marissa Atkinson DOB: May 26, 1986 MRN: [id number]  Date of admission: 04/26/2018 Delivering MD: Crawford Givens   Date of discharge: 04/29/2018  Admitting diagnosis: 40wks induction Intrauterine pregnancy: [redacted]w[redacted]d     Secondary diagnosis:  Active Problems:   Post-term pregnancy, 40-42 weeks of gestation  Additional problems: None     Discharge diagnosis: Term Pregnancy Delivered                                                                                                Post partum procedures:None  Augmentation: AROM, Pitocin and Cytotec  Complications: None  Hospital course:  Induction of Labor With Vaginal Delivery   32 y.o. yo G1P1001 at [redacted]w[redacted]d was admitted to the hospital 04/26/2018 for induction of labor.  Indication for induction: Postdates.  Patient had an uncomplicated labor course as follows: Membrane Rupture Time/Date: 7:54 PM ,04/26/2018   Intrapartum Procedures: Episiotomy: None [1]                                         Lacerations:  2nd degree [3];Perineal [11]  Patient had delivery of a Viable infant.  Information for the patient's newborn:  Georgana, Romain [[id number]]  Delivery Method: Vaginal, Spontaneous(Filed from Delivery Summary)   04/27/2018  Details of delivery can be found in separate delivery note.  Patient had a routine postpartum course. Patient is discharged home 04/29/18.  Physical exam  Vitals:   04/28/18 0536 04/28/18 1459 04/28/18 2340 04/29/18 0500  BP: 123/73 117/67 (!) 146/79 109/70  Pulse: 66 75 96 70  Resp: 16 17  18   Temp: 97.8 F (36.6 C) 98.9 F (37.2 C)  98.1 F (36.7 C)  TempSrc: Oral Oral  Oral  SpO2:  97%    Weight:      Height:       General: alert, cooperative and no distress Lochia: appropriate Uterine Fundus: firm Incision: N/A DVT Evaluation: No evidence of DVT seen on physical exam. Labs: Lab Results  Component Value Date   WBC 12.7 (H) 04/28/2018   HGB 9.6 (L) 04/28/2018    HCT 28.0 (L) 04/28/2018   MCV 89.5 04/28/2018   PLT 132 (L) 04/28/2018   No flowsheet data found.  Discharge instruction: per After Visit Summary and "Baby and Me Booklet".  After visit meds:  Allergies as of 04/29/2018   No Known Allergies     Medication List    STOP taking these medications   calcium carbonate 500 MG chewable tablet Commonly known as:  TUMS - dosed in mg elemental calcium   diphenhydrAMINE 2 % cream Commonly known as:  BENADRYL   docusate sodium 100 MG capsule Commonly known as:  COLACE   FUSION PLUS Caps     TAKE these medications   albuterol 108 (90 Base) MCG/ACT inhaler Commonly known as:  PROVENTIL HFA;VENTOLIN HFA Inhale 2 puffs into the lungs every 6 (six) hours as needed for wheezing or shortness of breath.  ibuprofen 600 MG tablet Commonly known as:  ADVIL,MOTRIN Take 1 tablet (600 mg total) by mouth every 6 (six) hours.   prenatal multivitamin Tabs tablet Take 1 tablet by mouth daily at 12 noon.       Diet: routine diet  Activity: Advance as tolerated. Pelvic rest for 6 weeks.   Outpatient follow up:6 weeks Follow up Appt:No future appointments. Follow up Visit:No follow-ups on file.  Postpartum contraception: IUD Mirena  Newborn Data: Live born female  Birth Weight: 7 lb 9 oz (3430 g) APGAR: 25, 9  Newborn Delivery   Birth date/time:  04/27/2018 08:26:00 Delivery type:  Vaginal, Spontaneous     Baby Feeding: Breast Disposition:home with mother   04/29/2018 Starla Link, CNM

## 2018-04-30 ENCOUNTER — Ambulatory Visit: Payer: Self-pay

## 2018-04-30 NOTE — Lactation Note (Signed)
This note was copied from a baby's chart. Lactation Consultation Note  Patient Name: Boy Merilynn Haydu POLID'C Date: 04/30/2018   Mom is 72 hours postpartum & her milk has come to volume. She was most recently able to pump 4 oz total. Mom has no breast complaints. At this time, she is content with pumping & bottle feeding.   Infant has gained 79g over the last 24 hours. Dad was viewed feeding bottle to "Tre." Dad encouraged to play "tug-of-war" with bottle at times to aid in oral muscle strength if Mom chooses to return infant to the breast.   Milk storage guidelines were reviewed, especially for if infant does not consume all of the EBM in a bottle.  Mom has a Motif pump at home. She reports knowing how to use the hand pumps (including on double-suction mode) that were included in the Medela pump kit.   Matthias Hughs [facility] 04/30/2018, 9:11 AM

## 2019-05-17 IMAGING — US US MFM OB COMP +14 WKS
1 of 2 series · 13 of 28 positions shown · non-contrast
Comparison: none

[Series 1: us mfm ob comp +14 wks · 13 of 122 slices shown]
[im 5/122]
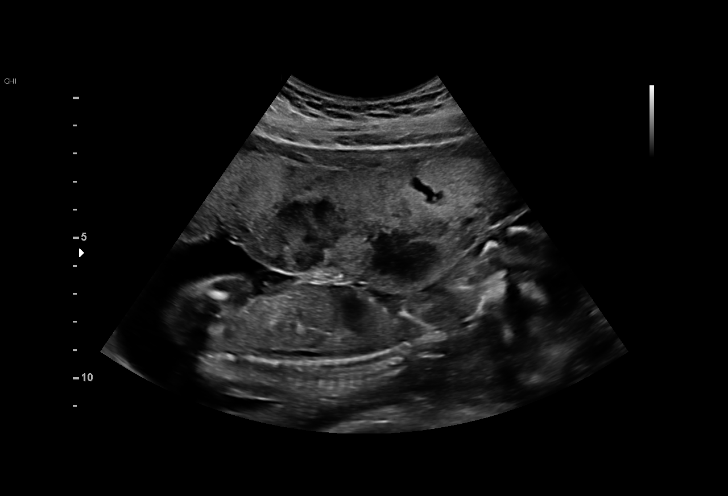
[im 14/122]
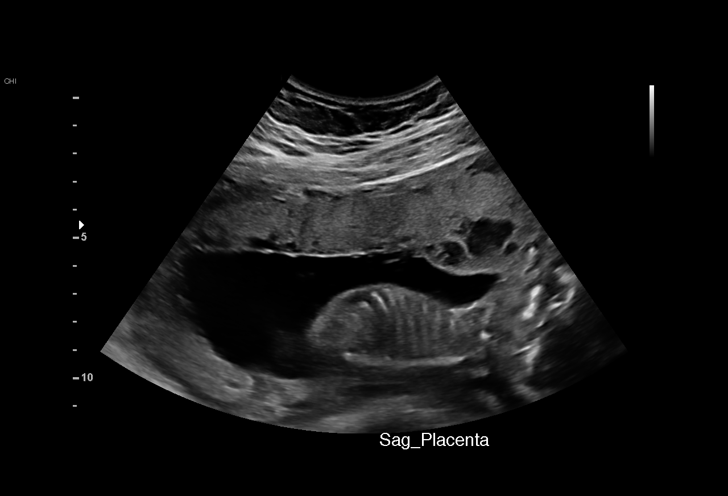
[im 24/122]
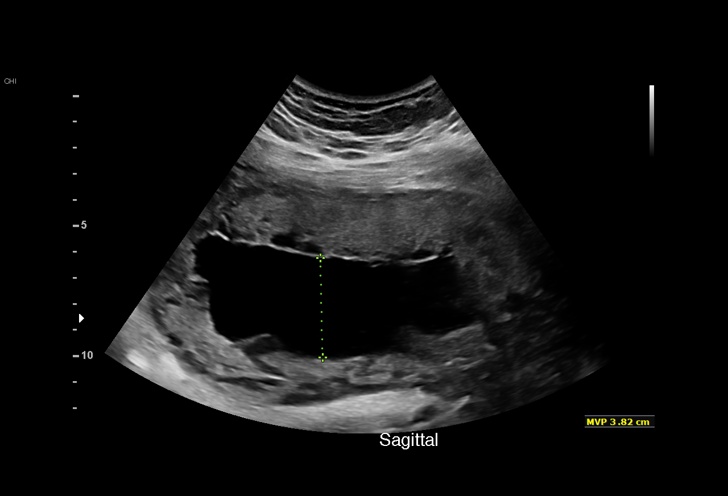
[im 33/122]
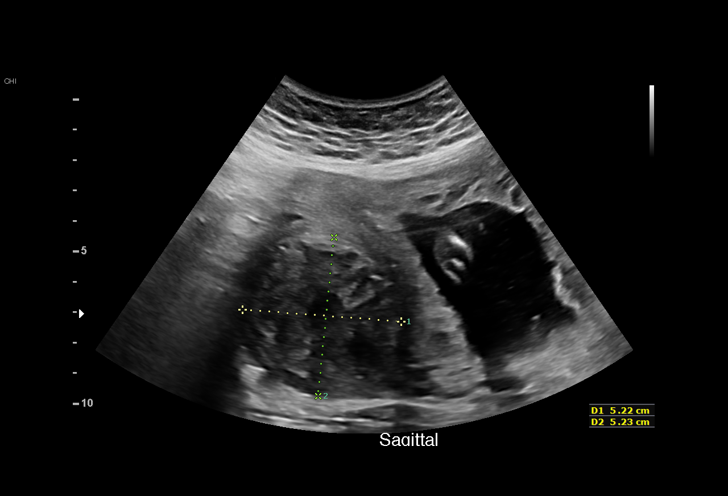
[im 42/122]
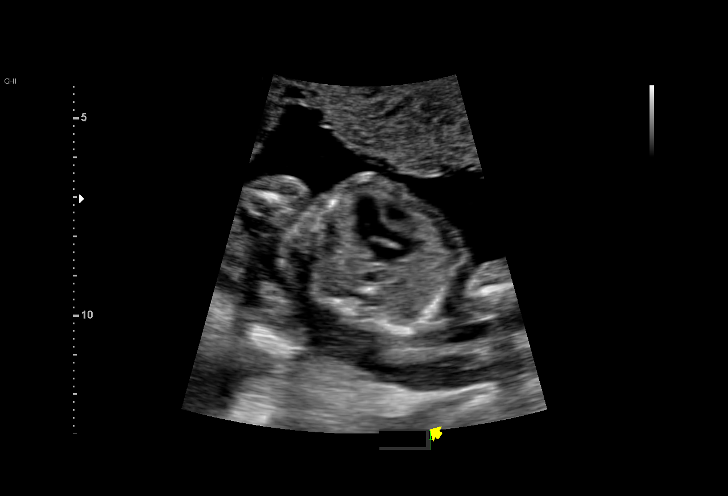
[im 52/122]
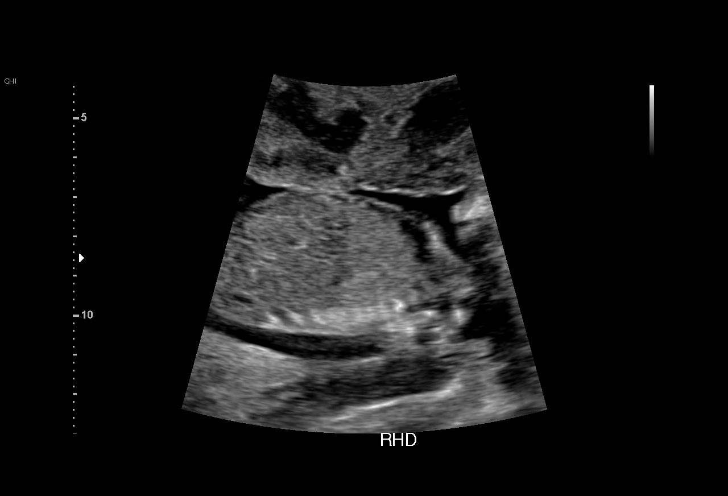
[im 66/122]
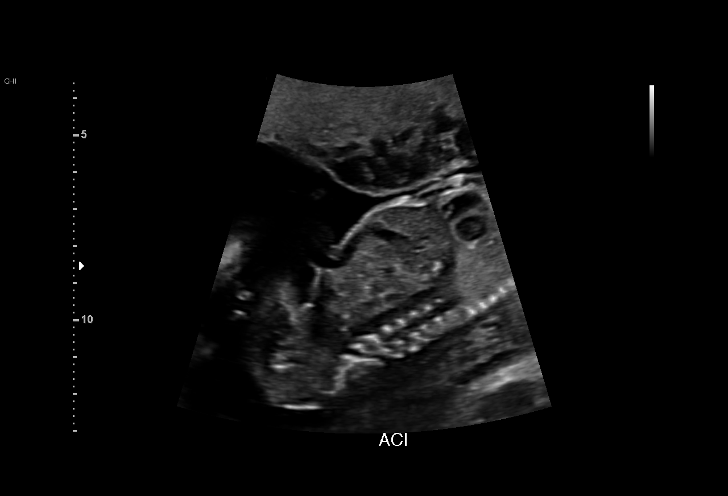
[im 75/122]
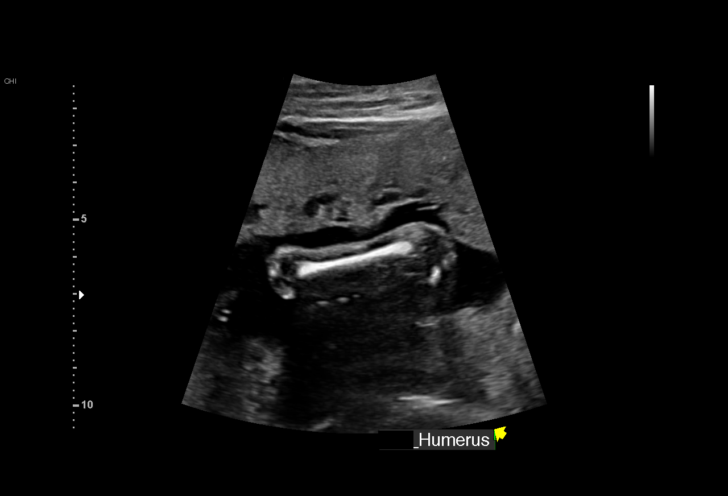
[im 84/122]
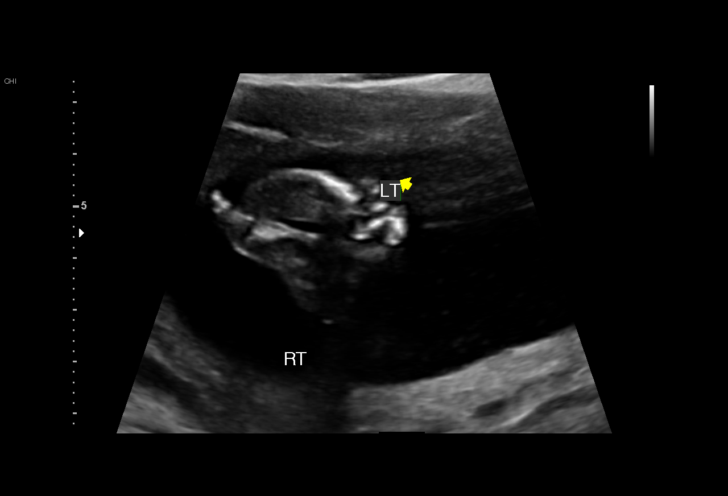
[im 94/122]
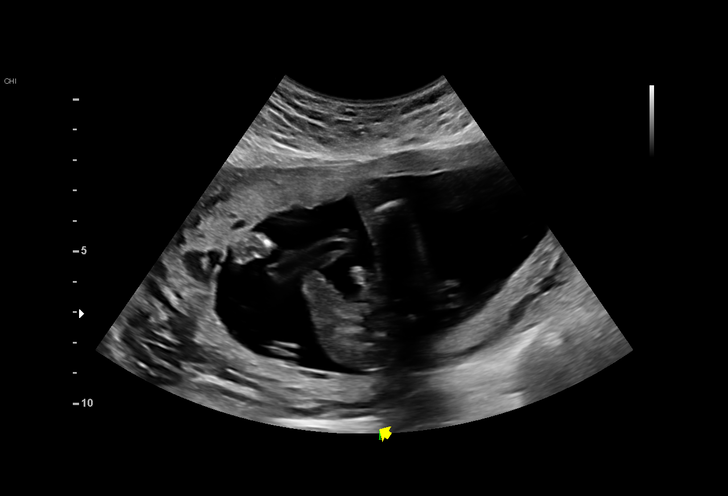
[im 103/122]
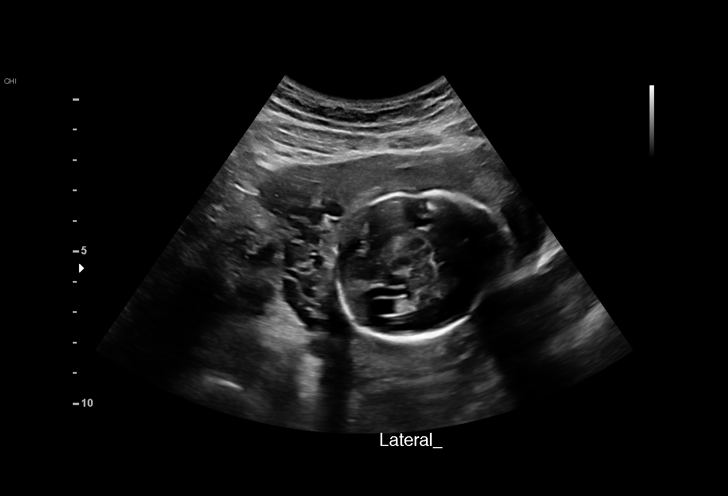
[im 112/122]
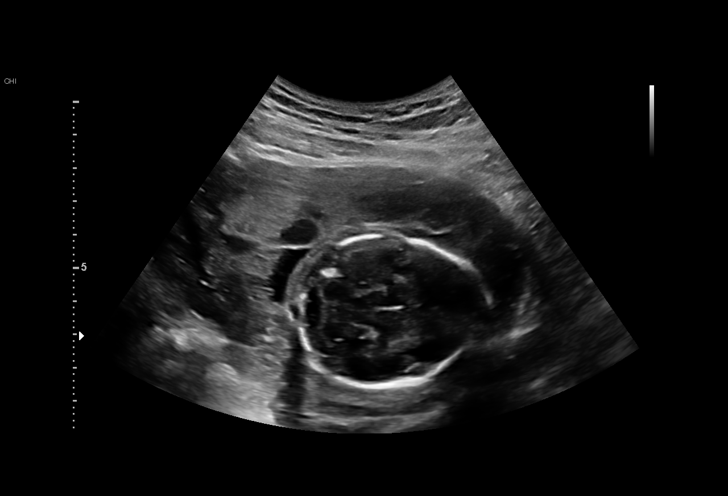
[im 122/122]
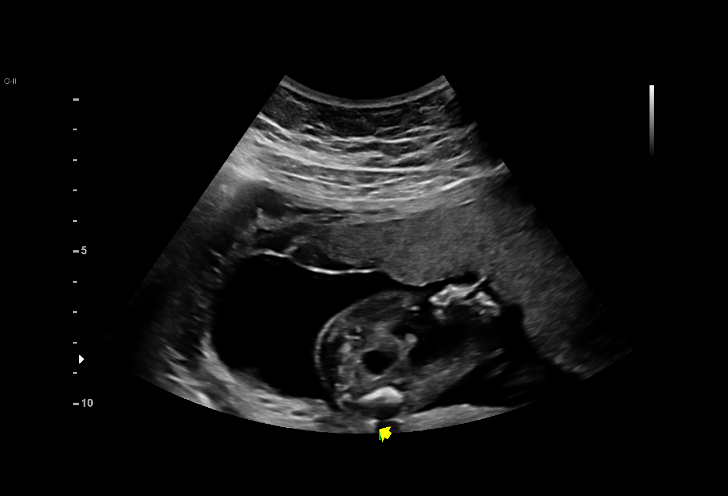

[13 of 28 positions shown; findings below may reference images not displayed]

Obstetrics &
Gynecology
[address]
[place]

1  NAVID PIAZZA           [id number]      [id number]     [id number]
Indications

20 weeks gestation of pregnancy
Encounter for antenatal screening for
malformations
Fetal Evaluation

Num Of Fetuses:     1
Fetal Heart         149
Rate(bpm):
Cardiac Activity:   Observed
Presentation:       Cephalic
Placenta:           Anterior, above cervical os
P. Cord Insertion:  Visualized, central

Amniotic Fluid
AFI FV:      Subjectively within normal limits

Largest Pocket(cm)
3.82
Biometry

BPD:      45.2  mm     G. Age:  19w 5d         20  %    CI:        71.73   %    70 - 86
FL/HC:      19.4   %    16.8 -
HC:      169.9  mm     G. Age:  19w 4d         11  %    HC/AC:      1.19        1.09 -
AC:       143   mm     G. Age:  19w 4d         21  %    FL/BPD:     72.8   %
FL:       32.9  mm     G. Age:  20w 2d         37  %    FL/AC:      23.0   %    20 - 24
HUM:      32.1  mm     G. Age:  20w 5d         59  %
CER:      19.2  mm     G. Age:  18w 4d        < 5  %
NFT:       3.1  mm
CM:        3.9  mm

Est. FW:     320  gm    0 lb 11 oz      36  %
Gestational Age

LMP:           26w 0d        Date:  06/05/17                 EDD:   03/12/18
U/S Today:     19w 6d                                        EDD:   04/24/18
Best:          20w 3d     Det. By:  Early Ultrasound         EDD:   04/20/18
(09/16/17)
Anatomy

Cranium:               Appears normal         Aortic Arch:            Not well visualized
Cavum:                 Appears normal         Ductal Arch:            Appears normal
Ventricles:            Appears normal         Diaphragm:              Appears normal
Choroid Plexus:        Appears normal         Stomach:                Appears normal, left
sided
Cerebellum:            Appears normal         Abdomen:                Appears normal
Posterior Fossa:       Appears normal         Abdominal Wall:         Appears nml (cord
insert, abd wall)
Nuchal Fold:           Appears normal         Cord Vessels:           Appears normal (3
vessel cord)
Face:                  Orbits appear          Kidneys:                Appear normal
normal
Lips:                  Not well visualized    Bladder:                Appears normal
Thoracic:              Appears normal         Spine:                  Not well visualized
Heart:                 Appears normal         Upper Extremities:      Appears normal
(4CH, axis, and situs
RVOT:                  Appears normal         Lower Extremities:      Appears normal
LVOT:                  Appears normal

Other:  Heels visualized. Fetus appears to be a male.
Cervix Uterus Adnexa

Cervix
Length:            3.3  cm.
Normal appearance by transabdominal scan.

Uterus
Single fibroid noted, see table below.

Left Ovary
Not visualized.

Right Ovary
Within normal limits.
Myomas

Site                     L(cm)      W(cm)      D(cm)      Location
Left                     5.2        5.2        6.1        Intramural

Blood Flow                 RI        PI       Comments
Impression

Singleton intrauterine pregnancy at 20+3 weeks here for
anatomic survey
Review of the anatomy shows no sonographic markers for
aneuploidy or structural anomalies
However, views of the face and spine should be considered
suboptimal secondary to fetal position
Amniotic fluid volume is normal
Estimated fetal weight shows growth in the 36th percentile
Recommendations

Recommend follow-up ultrasound examination in 4 weeks for
completion of anatomic survey

## 2022-09-14 ENCOUNTER — Emergency Department (HOSPITAL_BASED_OUTPATIENT_CLINIC_OR_DEPARTMENT_OTHER)
Admission: EM | Admit: 2022-09-14 | Discharge: 2022-09-14 | Disposition: A | Discharge status: Home / Self Care | Payer: 59 | Attending: Emergency Medicine | Admitting: Emergency Medicine

## 2022-09-14 ENCOUNTER — Other Ambulatory Visit: Payer: Self-pay

## 2022-09-14 ENCOUNTER — Encounter (HOSPITAL_BASED_OUTPATIENT_CLINIC_OR_DEPARTMENT_OTHER): Payer: Self-pay | Admitting: Emergency Medicine

## 2022-09-14 DIAGNOSIS — J3489 Other specified disorders of nose and nasal sinuses: Secondary | ICD-10-CM | POA: Insufficient documentation

## 2022-09-14 DIAGNOSIS — R0981 Nasal congestion: Secondary | ICD-10-CM | POA: Insufficient documentation

## 2022-09-14 DIAGNOSIS — M26609 Unspecified temporomandibular joint disorder, unspecified side: Secondary | ICD-10-CM | POA: Insufficient documentation

## 2022-09-14 DIAGNOSIS — R519 Headache, unspecified: Secondary | ICD-10-CM | POA: Diagnosis present

## 2022-09-14 DIAGNOSIS — M26629 Arthralgia of temporomandibular joint, unspecified side: Secondary | ICD-10-CM

## 2022-09-14 NOTE — Discharge Instructions (Addendum)
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  You also have some sinus congestion which may be contributing to your headaches.  Make sure you are hydrating, doing sinus rinses as we discussed, take Tylenol and ibuprofen as discussed below.  Follow-up with your primary care doctor have also printed some information for about TMJ  Please use Tylenol or ibuprofen for pain.  You may use 600 mg ibuprofen every 6 hours or 1000 mg of Tylenol every 6 hours.  You may choose to alternate between the 2.  This would be most effective.  Not to exceed 4 g of Tylenol within 24 hours.  Not to exceed 3200 mg ibuprofen 24 hours.   Return to the emergency room for any new or concerning symptoms as we discussed.

## 2022-09-14 NOTE — ED Provider Notes (Signed)
[facility] Provider Note   CSN: [id number] Arrival date & time: 09/14/22  1737     History {Add pertinent medical, surgical, social history, OB history to HPI:1} Chief Complaint  Patient presents with  . Headache    Marissa Atkinson is a 37 y.o. female.   Headache         Home Medications Prior to Admission medications   Medication Sig Start Date End Date Taking? Authorizing Provider  albuterol (PROVENTIL HFA;VENTOLIN HFA) 108 (90 Base) MCG/ACT inhaler Inhale 2 puffs into the lungs every 6 (six) hours as needed for wheezing or shortness of breath.    [provider]  ibuprofen (ADVIL,MOTRIN) 600 MG tablet Take 1 tablet (600 mg total) by mouth every 6 (six) hours. 04/29/18   Prothero, Pleas Koch, CNM  Prenatal Vit-Fe Fumarate-FA (PRENATAL MULTIVITAMIN) TABS tablet Take 1 tablet by mouth daily at 12 noon.    [provider]      Allergies    Patient has no known allergies.    Review of Systems   Review of Systems  Neurological:  Positive for headaches.    Physical Exam Updated Vital Signs BP 122/84   Pulse 74   Temp 98.8 F (37.1 C) (Oral)   Resp 16   Ht 5' 4"$  (1.626 m)   Wt 77.6 kg   LMP 08/23/2022   SpO2 99%   BMI 29.35 kg/m  Physical Exam Vitals and nursing note reviewed.  Constitutional:      General: She is not in acute distress.    Appearance: Normal appearance. She is not ill-appearing.  HENT:     Head: Normocephalic and atraumatic.  Eyes:     General: No scleral icterus.       Right eye: No discharge.        Left eye: No discharge.     Conjunctiva/sclera: Conjunctivae normal.  Pulmonary:     Effort: Pulmonary effort is normal.     Breath sounds: No stridor.  Skin:    General: Skin is warm and dry.  Neurological:     Mental Status: She is alert and oriented to person, place, and time. Mental status is at baseline.     Comments: Alert and oriented to self, place, time and event.    Speech is fluent, clear without dysarthria or dysphasia.   Strength 5/5 in upper/lower extremities  Sensation intact in upper/lower extremities   Normal gait.  Negative Romberg. No pronator drift.  Normal finger-to-nose and feet tapping.  CN I not tested  CN II grossly intact visual fields bilaterally. Did not visualize posterior eye.  CN III, IV, VI PERRLA and EOMs intact bilaterally  CN V Intact sensation to sharp and light touch to the face  CN VII facial movements symmetric  CN VIII not tested  CN IX, X no uvula deviation, symmetric rise of soft palate  CN XI 5/5 SCM and trapezius strength bilaterally  CN XII Midline tongue protrusion, symmetric L/R movements     ED Results / Procedures / Treatments   Labs (all labs ordered are listed, but only abnormal results are displayed) Labs Reviewed - No data to display  EKG None  Radiology No results found.  Procedures Procedures  {Document cardiac monitor, telemetry assessment procedure when appropriate:1}  Medications Ordered in ED Medications - No data to display  ED Course/ Medical Decision Making/ A&P Clinical Course as of 09/14/22 1925  Sat Sep 14, 2022  1830 Progressively worsening  headaches started Sunday. States some pressure in sinuses since last week.   [WF]    Clinical Course User Index [WF] Tedd Sias, PA   {   Click here for ABCD2, HEART and other calculatorsREFRESH Note before signing :1}                          Medical Decision Making         Final Clinical Impression(s) / ED Diagnoses Final diagnoses:  Sinus congestion  TMJ syndrome    Rx / DC Orders ED Discharge Orders     None

## 2022-09-14 NOTE — ED Triage Notes (Signed)
Pt reports HA since Sunday that is worsening; no hx of migraines; dx with flu on Wed; had tried various OTC meds w/o relief
